# Patient Record
Sex: Female | Born: 1970 | Race: White | Hispanic: No | Marital: Married | Smoking: Never smoker
Health system: Southern US, Community
[De-identification: ages and names within clinical notes are randomized; demographics above are authoritative.]

## PROBLEM LIST (undated history)

## (undated) DIAGNOSIS — Z9109 Other allergy status, other than to drugs and biological substances: Secondary | ICD-10-CM

## (undated) DIAGNOSIS — I10 Essential (primary) hypertension: Secondary | ICD-10-CM

## (undated) HISTORY — PX: NO PAST SURGERIES: SHX2092

---

## 2008-05-09 ENCOUNTER — Ambulatory Visit: Payer: Self-pay | Admitting: Internal Medicine

## 2010-03-26 ENCOUNTER — Ambulatory Visit: Payer: Self-pay | Admitting: Family Medicine

## 2011-04-22 ENCOUNTER — Ambulatory Visit: Payer: Self-pay

## 2014-08-21 ENCOUNTER — Ambulatory Visit
Admission: EM | Admit: 2014-08-21 | Discharge: 2014-08-21 | Disposition: A | Discharge status: Home / Self Care | Payer: Federal, State, Local not specified - PPO | Attending: Physician Assistant | Admitting: Physician Assistant

## 2014-08-21 ENCOUNTER — Encounter: Payer: Self-pay | Admitting: Emergency Medicine

## 2014-08-21 ENCOUNTER — Ambulatory Visit: Payer: Federal, State, Local not specified - PPO

## 2014-08-21 ENCOUNTER — Other Ambulatory Visit: Payer: Self-pay

## 2014-08-21 DIAGNOSIS — R0982 Postnasal drip: Secondary | ICD-10-CM

## 2014-08-21 DIAGNOSIS — R0789 Other chest pain: Secondary | ICD-10-CM | POA: Insufficient documentation

## 2014-08-21 DIAGNOSIS — M549 Dorsalgia, unspecified: Secondary | ICD-10-CM | POA: Insufficient documentation

## 2014-08-21 DIAGNOSIS — M542 Cervicalgia: Secondary | ICD-10-CM | POA: Diagnosis present

## 2014-08-21 DIAGNOSIS — J302 Other seasonal allergic rhinitis: Secondary | ICD-10-CM

## 2014-08-21 MED ORDER — KETOROLAC TROMETHAMINE 60 MG/2ML IM SOLN
60.0000 mg | Freq: Once | INTRAMUSCULAR | Status: AC
  Administered 2014-08-21: 60 mg via INTRAMUSCULAR

## 2014-08-21 NOTE — Discharge Instructions (Signed)
The exam today included physical exam findings as well as EKG and Chest Xray, all of which are reported as normal. These are reassuring that the most probable cause for the discomfort is muscle exertion and tension. You have been in a very busy life and had extra stressors recently. Please be reassurred that your exam is negative this morning, but do NOT ignore increased pain or shortness of breath ( do your re-breathe practice ) , nausea, vomiting, profuse sweating or other  symptoms  that cause you to feel concerned SHOULD THEY OCCUR PLEASE REPORT TO THE EMERGENCY ROOM OF YOUR CHOICE !!  If you do not have someone else to drive --or the symptoms present severely-please call the rescue squad !      Musculoskeletal Pain Musculoskeletal pain is muscle and boney aches and pains. These pains can occur in any part of the body. Your caregiver may treat you without knowing the cause of the pain. They may treat you if blood or urine tests, X-rays, and other tests were normal.  CAUSES There is often not a definite cause or reason for these pains. These pains may be caused by a type of germ (virus). The discomfort may also come from overuse. Overuse includes working out too hard when your body is not fit. Boney aches also come from weather changes. Bone is sensitive to atmospheric pressure changes. HOME CARE INSTRUCTIONS  Ask when your test results will be ready. Make sure you get your test results. Only take over-the-counter or prescription medicines for pain, discomfort, or fever as directed by your caregiver. If you were given medications for your condition, do not drive, operate machinery or power tools, or sign legal documents for 24 hours. Do not drink alcohol. Do not take sleeping pills or other medications that may interfere with treatment. Continue all activities unless the activities cause more pain. When the pain lessens, slowly resume normal activities. Gradually increase the intensity and duration  of the activities or exercise. During periods of severe pain, bed rest may be helpful. Lay or sit in any position that is comfortable. Putting ice on the injured area. Put ice in a bag. Place a towel between your skin and the bag. Leave the ice on for 15 to 20 minutes, 3 to 4 times a day. Follow up with your caregiver for continued problems and no reason can be found for the pain. If the pain becomes worse or does not go away, it may be necessary to repeat tests or do additional testing. Your caregiver may need to look further for a possible cause. SEEK IMMEDIATE MEDICAL CARE IF: You have pain that is getting worse and is not relieved by medications. You develop chest pain that is associated with shortness or breath, sweating, feeling sick to your stomach (nauseous), or throw up (vomit). Your pain becomes localized to the abdomen. You develop any new symptoms that seem different or that concern you. MAKE SURE YOU:  Understand these instructions. Will watch your condition. Will get help right away if you are not doing well or get worse. Document Released: 02/05/2005 Document Revised: 04/30/2011 Document Reviewed: 10/10/2012 [facility] Patient Information 2015 [place]. This information is not intended to replace advice given to you by your health care provider. Make sure you discuss any questions you have with your health care provider.

## 2014-08-21 NOTE — ED Notes (Signed)
Neck pain that radiates to chest makes it hard to take a deep breath. Painful  At collar bone when bending over for 4 days

## 2014-08-21 NOTE — ED Provider Notes (Addendum)
CSN: [id number]     Arrival date & time 08/21/14  0804 History   None    Chief Complaint  Patient presents with  . Neck Pain   (Consider location/radiation/quality/duration/timing/severity/associated sxs/prior Treatment) HPI  44 yo F presents concerned about upper back/neck discomfort over the past few days that now presents as left shoulder and upper left chest pressure. Discomfort increases when she bends over. No cough, No SOB, No nausea, diaphoresis or malaise.Had scratchy throat a few days ago for less than a day and it resolved. Has been under increased stress at work ( boss out sick),also involved in childrens summer school, and has been physically active lifting and carrying. Denies URI or malaise- has chronic post nasal drip and commonly develops sinus infections. Both her children are on allergy meds but she has never been advised the same by her report. Her sister is a Engineer, civil (consulting)nurse and encouraged her to come in today to "r/o heart attack". Non Smoker/never. Only meds BCP. Drove herself here.  History reviewed. No pertinent past medical history. Past Surgical History  Procedure Laterality Date  . No past surgeries     History reviewed. No pertinent family history. History  Substance Use Topics  . Smoking status: Never Smoker   . Smokeless tobacco: Not on file  . Alcohol Use: No   OB History    No data available     Review of Systems  Constitutional: no fever. Baseline level of activity. Eyes: No visual changes. No red eyes/discharge. NWG:NFAOZENT:Brief scratchy throat, denies ear pain, has constant post nasal drip Cardiovascular:See HPI chest pain/pressure- no diaphoresis, nausea Respiratory: Negative for shortness of breath Gastrointestinal: No abdominal pain. No nausea,vomiting.No Diarrhea.No constipation. Genitourinary: Negative for dysuria.Normal urination. Musculoskeletal: Negative for back pain. FROM extremities without pain Skin: Negative for rash Neurological: Negative for  headache, focal weakness or numbness   Allergies  Review of patient's allergies indicates no known allergies.  Home Medications   Prior to Admission medications   Medication Sig Start Date End Date Taking? Authorizing Provider  norethindrone-ethinyl estradiol-iron (ESTROSTEP FE,TILIA FE,TRI-LEGEST FE) 1-20/1-30/1-35 MG-MCG tablet Take 1 tablet by mouth daily.   Yes Historical Provider, MD   BP 129/86 mmHg  Temp(Src) 97.6 F (36.4 C) (Oral)  Resp 16  Ht 5\' 5"  (1.651 m)  Wt 147 lb (66.679 kg)  BMI 24.46 kg/m2  SpO2 99%  LMP 08/14/2014 Physical Exam  Constitutional -alert and oriented,well appearing and in no acute distress Head-atraumatic Eyes- conjunctiva normal, EOMI ,conjugate gaze Nose- no congestion or rhinorrhea Mouth/throat- mucous membranes moist ,oropharynx non-erythematous, cobblestoning Neck- supple without glandular enlargement CV- regular rate, grossly normal heart sounds, good peripheral circulation Resp-no distress, normal respiratory effort,clear to auscultation bilaterally, rate 16,unlabored, without pain GI-,no distention GU- not examined MSK- UEs- FROM without pain, palpation of pectoralis on left reveals discomfort similar to that which she has experienced; recreated with ROM routines. LEs:no lower extremity tenderness nor edema,no joint effusion, ambulatory without assistance or difficulty Neuro- normal speech and language, no gross focal neurological deficit appreciated, no gait instability Skin-warm,dry ,intact; no rash noted Psych-mood and affect grossly normal; speech and behavior grossly normal  ED Course  Procedures (including critical care time) Labs Review Labs Reviewed - No data to display  Imaging Review No results found.  EKG - NSR, rate 80, normal EKG      ( Results did not drop) CXR-No active cardiopulmonary disease    (Results did not drop)   MDM   1. Left-sided chest wall pain  2. Upper back pain on left side   3. Post-nasal drip    4. Seasonal allergies      Plan: 1.  Diagnosis reviewed with patient-seasonal allergies suspected, muscle strain/tension 2. Rx claritn, fluticasone ; risks, benefits, potential side effects reviewed with patient 3. Recommend supportive treatment with OTCguafenesin/tylenol/ibuprofen/fluids 4. Muscle tension use NSAIDs, hot showers, massage, relaxation techniques 4. F/u prn if symptoms worsen or don't improve; viral syndrome can have similar muscle aching 5. Do NOT ignore increased symptoms, SOB, diaphoresis,malaise, chest pain deep or other  concerns and report to medical for evaluation directly  6. Strongly encouraged to establish PCP care 1. Test/x-ray results and diagnosis reviewed with patient 2. rx as per orders; risks, benefits, potential side effects reviewed with patient 3. Recommend supportive treatment with  4. F/u prn if symptoms worsen or don't improve   Rae Halsted, PA-C 08/23/14 2136  Medications  ketorolac (TORADOL) injection 60 mg (60 mg Intramuscular Given 08/21/14 0945)  well tolerated by patient  Rae Halsted, PA-C 11/05/14 1711

## 2014-08-23 ENCOUNTER — Encounter: Payer: Self-pay | Admitting: Physician Assistant

## 2016-05-27 ENCOUNTER — Ambulatory Visit
Admission: EM | Admit: 2016-05-27 | Discharge: 2016-05-27 | Disposition: A | Discharge status: Home / Self Care | Payer: Federal, State, Local not specified - PPO

## 2016-05-27 DIAGNOSIS — J0100 Acute maxillary sinusitis, unspecified: Secondary | ICD-10-CM | POA: Diagnosis not present

## 2016-05-27 HISTORY — DX: Other allergy status, other than to drugs and biological substances: Z91.09

## 2016-05-27 MED ORDER — AMOXICILLIN-POT CLAVULANATE 875-125 MG PO TABS: 1.0000 | ORAL_TABLET | Freq: Two times a day (BID) | ORAL | 0 refills | Status: AC

## 2016-05-27 NOTE — ED Provider Notes (Signed)
CSN: [id number]     Arrival date & time 05/27/16  0856 History   First MD Initiated Contact with Patient 05/27/16 1010     Chief Complaint  Patient presents with  . Cough  . Facial Pain   (Consider location/radiation/quality/duration/timing/severity/associated sxs/prior Treatment) 46 year old female presents with nasal congestion, sinus headache and pressure for the past 5 days. Also experiencing low grade fever, nausea and bloody nasal discharge. Teeth hurt due to sinus pain. Denies any vomiting or diarrhea. Has taken Advil cold and sinus with minimal relief. Has history of seasonal allergies and recurrent sinus infections. Last infection about 6 months ago. Also has history of IBS but not on any current medication.    The history is provided by the patient.    Past Medical History:  Diagnosis Date  . Environmental allergies    Past Surgical History:  Procedure Laterality Date  . NO PAST SURGERIES     History reviewed. No pertinent family history. Social History  Substance Use Topics  . Smoking status: Never Smoker  . Smokeless tobacco: Never Used  . Alcohol use No   OB History    No data available     Review of Systems  Constitutional: Positive for fatigue and fever. Negative for appetite change and chills.  HENT: Positive for congestion, nosebleeds, postnasal drip, rhinorrhea, sinus pain, sinus pressure and sore throat. Negative for ear discharge, ear pain, facial swelling, sneezing, tinnitus and trouble swallowing.   Eyes: Negative for discharge and visual disturbance.  Respiratory: Positive for cough. Negative for chest tightness, shortness of breath and wheezing.   Cardiovascular: Negative for chest pain.  Gastrointestinal: Positive for nausea. Negative for abdominal pain, diarrhea and vomiting.  Genitourinary: Negative for difficulty urinating.  Musculoskeletal: Negative for arthralgias, back pain, myalgias and neck pain.  Skin: Negative for rash and wound.   Neurological: Positive for headaches. Negative for dizziness, syncope, weakness, light-headedness and numbness.  Hematological: Negative for adenopathy.    Allergies  Patient has no known allergies.  Home Medications   Prior to Admission medications   Medication Sig Start Date End Date Taking? Authorizing Provider  fluticasone (FLONASE) 50 MCG/ACT nasal spray Place into both nostrils daily.   Yes Historical Provider, MD  amoxicillin-clavulanate (AUGMENTIN) 875-125 MG tablet Take 1 tablet by mouth every 12 (twelve) hours. 05/27/16 06/03/16  Sudie Grumbling, NP  norethindrone-ethinyl estradiol-iron (ESTROSTEP FE,TILIA FE,TRI-LEGEST FE) 1-20/1-30/1-35 MG-MCG tablet Take 1 tablet by mouth daily.    Historical Provider, MD   Meds Ordered and Administered this Visit  Medications - No data to display  BP (!) 165/91 (BP Location: Left Arm)   Pulse 92   Temp 97.5 F (36.4 C) (Oral)   Resp 18   Ht 5' 5.5" (1.664 m)   Wt 162 lb (73.5 kg)   LMP 03/29/2016   SpO2 95%   BMI 26.55 kg/m  No data found.   Physical Exam  Constitutional: She is oriented to person, place, and time. She appears well-developed and well-nourished. She appears ill. No distress.  HENT:  Head: Normocephalic and atraumatic.  Right Ear: Hearing, external ear and ear canal normal. Tympanic membrane is bulging. A middle ear effusion is present.  Left Ear: Hearing, external ear and ear canal normal. Tympanic membrane is bulging. A middle ear effusion is present.  Nose: Mucosal edema and rhinorrhea present. Right sinus exhibits maxillary sinus tenderness. Right sinus exhibits no frontal sinus tenderness. Left sinus exhibits maxillary sinus tenderness. Left sinus exhibits no frontal sinus  tenderness.  Mouth/Throat: Uvula is midline and mucous membranes are normal. Posterior oropharyngeal erythema present.  Neck: Normal range of motion. Neck supple.  Cardiovascular: Normal rate, regular rhythm and normal heart sounds.   No  murmur heard. Pulmonary/Chest: Effort normal and breath sounds normal. No respiratory distress. She has no decreased breath sounds. She has no wheezes. She has no rhonchi.  Lymphadenopathy:    She has no cervical adenopathy.  Neurological: She is alert and oriented to person, place, and time.  Skin: Skin is warm and dry. Capillary refill takes less than 2 seconds.  Psychiatric: She has a normal mood and affect. Her behavior is normal. Judgment and thought content normal.    Urgent Care Course     Procedures (including critical care time)  Labs Review Labs Reviewed - No data to display  Imaging Review No results found.   Visual Acuity Review  Right Eye Distance:   Left Eye Distance:   Bilateral Distance:    Right Eye Near:   Left Eye Near:    Bilateral Near:         MDM   1. Acute non-recurrent maxillary sinusitis    Recommend start Augmentin  twice a day as directed. May continue Flonase as directed. May also try Claritin  daily for allergy symptoms. Avoid Sudafed or decongestants due to possible increase in blood pressure. Increase fluid intake to help loosen up mucus. Follow-up with your primary care provider in 3 to 4 days if not improving.      Sudie Grumbling, NP 05/27/16 [phone]

## 2016-05-27 NOTE — Discharge Instructions (Addendum)
Start Augmentin  twice a day as directed. May continue Flonase as directed. May also try Claritin  daily for allergy symptoms. Increase fluid intake to help loosen up mucus. Follow-up with your primary care provider in 3 to 4 days if not improving.

## 2016-05-27 NOTE — ED Triage Notes (Signed)
Pt with facial pain/headache, blowing blood from nose, cough. Pain 7/10. Sx x Wednesday and hx of sinus infections.

## 2018-07-20 ENCOUNTER — Encounter: Payer: Self-pay | Admitting: Emergency Medicine

## 2018-07-20 ENCOUNTER — Ambulatory Visit
Admission: EM | Admit: 2018-07-20 | Discharge: 2018-07-20 | Disposition: A | Discharge status: Home / Self Care | Payer: Federal, State, Local not specified - PPO

## 2018-07-20 ENCOUNTER — Other Ambulatory Visit: Payer: Self-pay

## 2018-07-20 DIAGNOSIS — H1033 Unspecified acute conjunctivitis, bilateral: Secondary | ICD-10-CM

## 2018-07-20 MED ORDER — POLYMYXIN B-TRIMETHOPRIM 10000-0.1 UNIT/ML-% OP SOLN: 1.0000 [drp] | Freq: Four times a day (QID) | OPHTHALMIC | 0 refills | Status: DC

## 2018-07-20 NOTE — Discharge Instructions (Addendum)
It was very nice meeting you today in clinic. Thank you for entrusting me with your care.   Please utilize the medications that we discussed. Your prescriptions have been called in to your pharmacy. Cool compresses can help soothe your eyes. AVOID rubbing your eyes.   Make arrangements to follow up with your regular doctor, or your eye doctor, in 1 week for re-evaluation if not improving. If your symptoms/condition worsens, please seek follow up care either here or in the ER. Please remember, our [facility] providers are "right here with you" when you need [place].   Again, it was my pleasure to take care of you today. Thank you for choosing our clinic. I hope that you start to feel better quickly.   Quentin Mulling, MSN, APRN, FNP-C, CEN Advanced Practice Provider  [facility]

## 2018-07-20 NOTE — ED Triage Notes (Signed)
Patient c/o redness, drainage and pain in both eyes that started yesterday.  Patient denies fevers.

## 2018-07-20 NOTE — ED Provider Notes (Signed)
[address], [address] [place], [place] [address] [phone]   Name: Bonnie GuysBeatrice R Marsland DOB: 1970/06/12 MRN: [id number] CSN: [id number] PCP: Marina GoodellFeldpausch, Dale E, MD  Arrival date and time:  07/20/18 1034  Chief Complaint:  Eye Problem  NOTE: Prior to seeing the patient today, I have reviewed the triage nursing documentation and vital signs. Clinical staff has updated patient's PMH/PSHx, current medication list, and drug allergies/intolerances to ensure comprehensive history available to assist in medical decision making.   History:   HPI: Bonnie Hoffman is a 48 y.o. female who presents today with complaints of pain and redness in her BILATERAL eyes that began with acute onset on 07/19/2018.  Patient notes that her eyes have been draining a "creamy" substance and that her vision has been blurred (R>L). (+) excessive tearing throughout the day yesterday. She woke this morning with an excessive amount of exudative crusting that had her eyes "matted shut".  Patient denies history of seasonal allergies.  She has not been working outside.  Patient denies any injuries to her eyes.  She is making a conscious effort to avoid excessive rubbing.  Visual Acuity: Right Eye Distance: 20/50 uncorrected Left Eye Distance: 20/40 uncorrected Bilateral Distance: 20/30 uncorrected     Past Medical History:  Diagnosis Date  . Environmental allergies     Past Surgical History:  Procedure Laterality Date  . NO PAST SURGERIES      Family History  Problem Relation Age of Onset  . Healthy Mother   . Cancer Father     Social History   Socioeconomic History  . Marital status: Married    Spouse name: Not on file  . Number of children: Not on file  . Years of education: Not on file  . Highest education level: Not on file  Occupational History  . Not on file  Social Needs  . Financial resource strain: Not on file  . Food insecurity:    Worry: Not on file    Inability: Not on file  .  Transportation needs:    Medical: Not on file    Non-medical: Not on file  Tobacco Use  . Smoking status: Never Smoker  . Smokeless tobacco: Never Used  Substance and Sexual Activity  . Alcohol use: No  . Drug use: No  . Sexual activity: Not on file  Lifestyle  . Physical activity:    Days per week: Not on file    Minutes per session: Not on file  . Stress: Not on file  Relationships  . Social connections:    Talks on phone: Not on file    Gets together: Not on file    Attends religious service: Not on file    Active member of club or organization: Not on file    Attends meetings of clubs or organizations: Not on file    Relationship status: Not on file  . Intimate partner violence:    Fear of current or ex partner: Not on file    Emotionally abused: Not on file    Physically abused: Not on file    Forced sexual activity: Not on file  Other Topics Concern  . Not on file  Social History Narrative  . Not on file    There are no active problems to display for this patient.   Home Medications:    Current Meds  Medication Sig  . loratadine (CLARITIN) 10 MG tablet Take 10 mg by mouth daily.  . norethindrone-ethinyl estradiol-iron (ESTROSTEP FE,TILIA FE,TRI-LEGEST  FE) 1-20/1-30/1-35 MG-MCG tablet Take 1 tablet by mouth daily.  . sertraline (ZOLOFT) 25 MG tablet Take by mouth.    Allergies:   Patient has no known allergies.  Review of Systems (ROS): Review of Systems  Constitutional: Negative for chills and fever.  HENT: Negative for congestion, rhinorrhea, sinus pressure, sinus pain and sore throat.   Eyes: Positive for pain, discharge ("creamy white"), redness, itching and visual disturbance (blurred). Negative for photophobia.  Respiratory: Negative for cough and shortness of breath.   Cardiovascular: Negative for chest pain and palpitations.  Allergic/Immunologic: Negative for environmental allergies (denies seasonal allergies).  Neurological: Negative for  dizziness and headaches.     Physical Exam:  Triage Vital Signs ED Triage Vitals  Enc Vitals Group     BP 07/20/18 1125 (!) 179/99     Pulse Rate 07/20/18 1125 69     Resp 07/20/18 1125 14     Temp 07/20/18 1125 99.2 F (37.3 C)     Temp Source 07/20/18 1125 Oral     SpO2 07/20/18 1125 99 %     Weight 07/20/18 1122 162 lb (73.5 kg)     Height 07/20/18 1122 5' 5.5" (1.664 m)     Head Circumference --      Peak Flow --      Pain Score 07/20/18 1122 2     Pain Loc --      Pain Edu? --      Excl. in GC? --     Physical Exam  Constitutional: She is oriented to person, place, and time and well-developed, well-nourished, and in no distress.  HENT:  Head: Normocephalic and atraumatic.  Mouth/Throat: Oropharynx is clear and moist and mucous membranes are normal.  Eyes: Pupils are equal, round, and reactive to light. EOM are normal. Lids are everted and swept, no foreign bodies found. Right eye exhibits exudate (+) honey colored crusting to bottom lashes. No foreign body present in the right eye. Left eye exhibits exudate (+) honey colored crusting to bottom lashes. No foreign body present in the left eye. Right conjunctiva is injected. Left conjunctiva is injected.  External eyes tender. No significant upper/lower lid edema.   Neck: Normal range of motion. Neck supple. No tracheal deviation present.  Cardiovascular: Normal rate, regular rhythm, normal heart sounds and intact distal pulses. Exam reveals no gallop and no friction rub.  No murmur heard. Pulmonary/Chest: Effort normal and breath sounds normal. No respiratory distress. She has no wheezes. She has no rales.  Neurological: She is alert and oriented to person, place, and time.  Skin: Skin is warm and dry. No rash noted. No erythema.  Psychiatric: Mood, affect and judgment normal.  Nursing note and vitals reviewed.    Urgent Care Treatments / Results:   LABS: PLEASE NOTE: all labs that were ordered this encounter are  listed, however only abnormal results are displayed. Labs Reviewed - No data to display  EKG: -None  RADIOLOGY: No results found.  PRODEDURES: Procedures  MEDICATIONS RECEIVED THIS VISIT: Medications - No data to display  PERTINENT CLINICAL COURSE NOTES/UPDATES: No data to display   Initial Impression / Assessment and Plan / Urgent Care Course:    JOLEE SEARER is a 48 y.o. female who [facility] today with complaints of Eye Problem  Pertinent labs & imaging results that were available during my care of the patient were personally reviewed by me and considered in my medical decision making (see lab/imaging section of  note for values and interpretations).  Exam reveals exudative crusting bilaterally.  Sclera of both eyes are erythematous.  External eyelids are tender to palpation.  Patient notes that her vision is blurred, and that eyes feel "scratchy".  PMH not significant for issues with allergies, and patient denies being outdoors.  Differential includes allergic versus bacterial conjunctivitis.  With pain, vision changes, and noted exudative crusting will treat for bacterial conjunctivitis using Polytrim ophthalmic drops 4 times daily to both eyes x5 days.  Patient encouraged to utilize cool compresses to help soothe her eyes.  She was educated on transmission and encouraged to avoid rubbing her eyes and wash her hands frequently.  Discussed follow up with primary care physician or eye provider in 1 week for re-evaluation if not resolved. I have reviewed the follow up and strict return precautions for any new or worsening symptoms. Patient is aware of symptoms that would be deemed urgent/emergent, and would thus require further evaluation either here or in the emergency department. At the time of discharge, she verbalized understanding and consent with the discharge plan as it was reviewed with her. All questions were fielded by provider and/or clinic staff prior  to patient discharge.    Final Clinical Impressions(s) / Urgent Care Diagnoses:   Final diagnoses:  Acute bacterial conjunctivitis of both eyes    New Prescriptions:   Meds ordered this encounter  Medications  . trimethoprim-polymyxin b (POLYTRIM) ophthalmic solution    Sig: Place 1 drop into both eyes every 6 (six) hours. X 5 days    Dispense:  10 mL    Refill:  0    Controlled Substance Prescriptions:  [place] Controlled Substance Registry consulted? Not Applicable  NOTE: This note was prepared using Dragon dictation software along with smaller phrase technology. Despite my best ability to proofread, there is the potential that transcriptional errors may still occur from this process, and are completely unintentional.      Verlee Monte, NP 07/21/18 1151

## 2020-04-20 ENCOUNTER — Other Ambulatory Visit: Payer: Self-pay | Admitting: Gastroenterology

## 2020-04-20 DIAGNOSIS — R748 Abnormal levels of other serum enzymes: Secondary | ICD-10-CM

## 2020-04-29 ENCOUNTER — Other Ambulatory Visit: Payer: Self-pay

## 2020-04-29 ENCOUNTER — Ambulatory Visit
Admission: RE | Admit: 2020-04-29 | Discharge: 2020-04-29 | Disposition: A | Discharge status: Home / Self Care | Payer: Federal, State, Local not specified - PPO | Source: Ambulatory Visit | Attending: Gastroenterology | Admitting: Gastroenterology

## 2020-04-29 DIAGNOSIS — R748 Abnormal levels of other serum enzymes: Secondary | ICD-10-CM | POA: Insufficient documentation

## 2020-07-24 ENCOUNTER — Ambulatory Visit
Admission: EM | Admit: 2020-07-24 | Discharge: 2020-07-24 | Disposition: A | Discharge status: Home / Self Care | Payer: Federal, State, Local not specified - PPO | Attending: Emergency Medicine | Admitting: Emergency Medicine

## 2020-07-24 ENCOUNTER — Other Ambulatory Visit: Payer: Self-pay

## 2020-07-24 DIAGNOSIS — J069 Acute upper respiratory infection, unspecified: Secondary | ICD-10-CM | POA: Insufficient documentation

## 2020-07-24 HISTORY — DX: Essential (primary) hypertension: I10

## 2020-07-24 LAB — POCT RAPID STREP A: Streptococcus, Group A Screen (Direct): NEGATIVE

## 2020-07-24 MED ORDER — BENZONATATE 100 MG PO CAPS: 200.0000 mg | ORAL_CAPSULE | Freq: Three times a day (TID) | ORAL | 0 refills | Status: DC

## 2020-07-24 MED ORDER — IPRATROPIUM BROMIDE 0.06 % NA SOLN: 2.0000 | Freq: Four times a day (QID) | NASAL | 12 refills | Status: DC

## 2020-07-24 MED ORDER — PROMETHAZINE-DM 6.25-15 MG/5ML PO SYRP: 5.0000 mL | ORAL_SOLUTION | Freq: Four times a day (QID) | ORAL | 0 refills | Status: DC | PRN

## 2020-07-24 NOTE — Discharge Instructions (Addendum)
Use the Atrovent nasal spray, 2 squirts in each nostril every 6 hours, as needed for runny nose and postnasal drip.  Use the Tessalon Perles every 8 hours during the day.  Take them with a small sip of water.  They may give you some numbness to the base of your tongue or a metallic taste in your mouth, this is normal.  Use the Promethazine DM cough syrup at bedtime for cough and congestion.  It will make you drowsy so do not take it during the day.  We will send your throat swab for culture and treat you if it comes back positive.  Return for reevaluation or see your primary care provider for any new or worsening symptoms.

## 2020-07-24 NOTE — ED Triage Notes (Signed)
Pt c/o sore throat since Friday, cough, ear pain and runny nose. Pt also reports elevated temp of 99.1 last night. Pt denies chills, n/v/d or other symptoms. Pt has not taken any meds for her symptoms.

## 2020-07-24 NOTE — ED Provider Notes (Signed)
[facility]    CSN: [id number] Arrival date & time: 07/24/20  0816      History   Chief Complaint Chief Complaint  Patient presents with  . Sore Throat    HPI Bonnie Hoffman is a 50 y.o. female.   HPI   50 year old female here for evaluation of sore throat.  That she has had a sore throat for the past 2 days.  She is also had the associated symptoms of nasal congestion with clear nasal discharge, scratchy and sharp sore throat, nonproductive cough, and body aches.  She had an elevated temp to 99.1.  She denies shortness of breath or wheezing, GI complaints, or COVID or flu exposure.  She has had her flu vaccine.  Patient reports that she was exposed to a member of her church last Sunday that had a sore throat but she is unsure of what that source was.  Past Medical History:  Diagnosis Date  . Environmental allergies   . Hypertension     There are no problems to display for this patient.   Past Surgical History:  Procedure Laterality Date  . NO PAST SURGERIES      OB History   No obstetric history on file.      Home Medications    Prior to Admission medications   Medication Sig Start Date End Date Taking? Authorizing Provider  benzonatate (TESSALON) 100 MG capsule Take 2 capsules (200 mg total) by mouth every 8 (eight) hours. 07/24/20  Yes Becky Augusta, NP  ipratropium (ATROVENT) 0.06 % nasal spray Place 2 sprays into both nostrils 4 (four) times daily. 07/24/20  Yes Becky Augusta, NP  levonorgestrel (MIRENA, 52 MG,) 20 MCG/DAY IUD by Intrauterine route.   Yes [provider]  loratadine (CLARITIN) 10 MG tablet Take 10 mg by mouth daily.   Yes [provider]  losartan (COZAAR) 50 MG tablet Take 1 tablet by mouth daily. 06/07/20  Yes [provider]  promethazine-dextromethorphan (PROMETHAZINE-DM) 6.25-15 MG/5ML syrup Take 5 mLs by mouth 4 (four) times daily as needed. 07/24/20  Yes Becky Augusta, NP  norethindrone-ethinyl  estradiol-iron (ESTROSTEP FE,TILIA FE,TRI-LEGEST FE) 1-20/1-30/1-35 MG-MCG tablet Take 1 tablet by mouth daily.  07/24/20  [provider]  sertraline (ZOLOFT) 25 MG tablet Take by mouth. 03/24/18 07/24/20  [provider]    Family History Family History  Problem Relation Age of Onset  . Healthy Mother   . Cancer Father     Social History Social History   Tobacco Use  . Smoking status: Never Smoker  . Smokeless tobacco: Never Used  Vaping Use  . Vaping Use: Never used  Substance Use Topics  . Alcohol use: No  . Drug use: No     Allergies   Patient has no known allergies.   Review of Systems Review of Systems  Constitutional: Positive for fever. Negative for activity change and appetite change.  HENT: Positive for congestion, ear pain, postnasal drip, rhinorrhea and sore throat.   Respiratory: Positive for cough. Negative for shortness of breath and wheezing.   Gastrointestinal: Negative for diarrhea, nausea and vomiting.  Musculoskeletal: Positive for arthralgias and myalgias.  Skin: Negative for rash.  Hematological: Negative.   Psychiatric/Behavioral: Negative.      Physical Exam Triage Vital Signs ED Triage Vitals  Enc Vitals Group     BP 07/24/20 0842 134/76     Pulse Rate 07/24/20 0842 93     Resp 07/24/20 0842 18  Temp 07/24/20 0842 98.5 F (36.9 C)     Temp Source 07/24/20 0842 Oral     SpO2 07/24/20 0842 99 %     Weight 07/24/20 0840 170 lb (77.1 kg)     Height 07/24/20 0840 5\' 5"  (1.651 m)     Head Circumference --      Peak Flow --      Pain Score 07/24/20 0840 8     Pain Loc --      Pain Edu? --      Excl. in GC? --    No data found.  Updated Vital Signs BP 134/76 (BP Location: Left Arm)   Pulse 93   Temp 98.5 F (36.9 C) (Oral)   Resp 18   Ht 5\' 5"  (1.651 m)   Wt 170 lb (77.1 kg)   SpO2 99%   BMI 28.29 kg/m   Visual Acuity Right Eye Distance:   Left Eye Distance:   Bilateral Distance:    Right Eye Near:    Left Eye Near:    Bilateral Near:     Physical Exam Vitals and nursing note reviewed.  Constitutional:      General: She is not in acute distress.    Appearance: Normal appearance. She is normal weight. She is not ill-appearing.  HENT:     Head: Normocephalic and atraumatic.     Right Ear: Tympanic membrane, ear canal and external ear normal. There is no impacted cerumen.     Left Ear: Tympanic membrane, ear canal and external ear normal. There is no impacted cerumen.     Nose: Congestion and rhinorrhea present.     Mouth/Throat:     Mouth: Mucous membranes are moist.     Pharynx: Oropharyngeal exudate and posterior oropharyngeal erythema present.  Cardiovascular:     Rate and Rhythm: Normal rate and regular rhythm.     Pulses: Normal pulses.     Heart sounds: Normal heart sounds. No murmur heard. No gallop.   Pulmonary:     Effort: Pulmonary effort is normal.     Breath sounds: Normal breath sounds. No wheezing, rhonchi or rales.  Musculoskeletal:     Cervical back: Normal range of motion and neck supple.  Lymphadenopathy:     Cervical: No cervical adenopathy.  Skin:    General: Skin is warm and dry.     Capillary Refill: Capillary refill takes less than 2 seconds.     Findings: No erythema or rash.  Neurological:     General: No focal deficit present.     Mental Status: She is alert and oriented to person, place, and time.  Psychiatric:        Mood and Affect: Mood normal.        Behavior: Behavior normal.        Thought Content: Thought content normal.        Judgment: Judgment normal.      UC Treatments / Results  Labs (all labs ordered are listed, but only abnormal results are displayed) Labs Reviewed  CULTURE, GROUP A STREP [facility])  POCT RAPID STREP A, ED / UC  POCT RAPID STREP A    EKG   Radiology No results found.  Procedures Procedures (including critical care time)  Medications Ordered in UC Medications - No data to display  Initial  Impression / Assessment and Plan / UC Course  I have reviewed the triage vital signs and the nursing notes.  Pertinent labs & imaging results that were  available during my care of the patient were reviewed by me and considered in my medical decision making (see chart for details).   50 year old female here for evaluation of cold symptoms as outlined in HPI above.  Physical exam reveals pearly gray tympanic membranes with a normal light reflex and clear external auditory canals.  Nasal mucosa is erythematous and edematous with scant clear nasal discharge.  Oropharyngeal exam reveals erythema of the tonsillar pillars with 1+ edema and white exudate.  Posterior oropharynx has mild postnasal drip and erythema.  No cervical lymphadenopathy appreciated exam.  Cardiopulmonary exam is benign.  We will collect rapid strep on patient.  Prep strep is negative we will send confirmatory culture.  Will discharge patient home with a diagnosis of viral URI with Atrovent nasal spray, Tessalon Perles, and Promethazine DM cough syrup for symptom relief.   Final Clinical Impressions(s) / UC Diagnoses   Final diagnoses:  Viral URI with cough     Discharge Instructions     Use the Atrovent nasal spray, 2 squirts in each nostril every 6 hours, as needed for runny nose and postnasal drip.  Use the Tessalon Perles every 8 hours during the day.  Take them with a small sip of water.  They may give you some numbness to the base of your tongue or a metallic taste in your mouth, this is normal.  Use the Promethazine DM cough syrup at bedtime for cough and congestion.  It will make you drowsy so do not take it during the day.  We will send your throat swab for culture and treat you if it comes back positive.  Return for reevaluation or see your primary care provider for any new or worsening symptoms.     ED Prescriptions    Medication Sig Dispense Auth. Provider   ipratropium (ATROVENT) 0.06 % nasal spray Place  2 sprays into both nostrils 4 (four) times daily. 15 mL Becky Augusta, NP   benzonatate (TESSALON) 100 MG capsule Take 2 capsules (200 mg total) by mouth every 8 (eight) hours. 21 capsule Becky Augusta, NP   promethazine-dextromethorphan (PROMETHAZINE-DM) 6.25-15 MG/5ML syrup Take 5 mLs by mouth 4 (four) times daily as needed. 118 mL Becky Augusta, NP     PDMP not reviewed this encounter.   Becky Augusta, NP 07/24/20 [phone]

## 2020-07-27 LAB — CULTURE, GROUP A STREP (THRC)

## 2022-01-13 IMAGING — US US ABDOMEN COMPLETE
1 series · 14 of 25 positions shown · non-contrast
Comparison: None.

CLINICAL DATA: Elevated liver enzymes

EXAM:
ABDOMEN ULTRASOUND COMPLETE

[Series 1: us abdomen complete · 0.25mm/px · 14 of 79 slices shown]
[im 1/79]
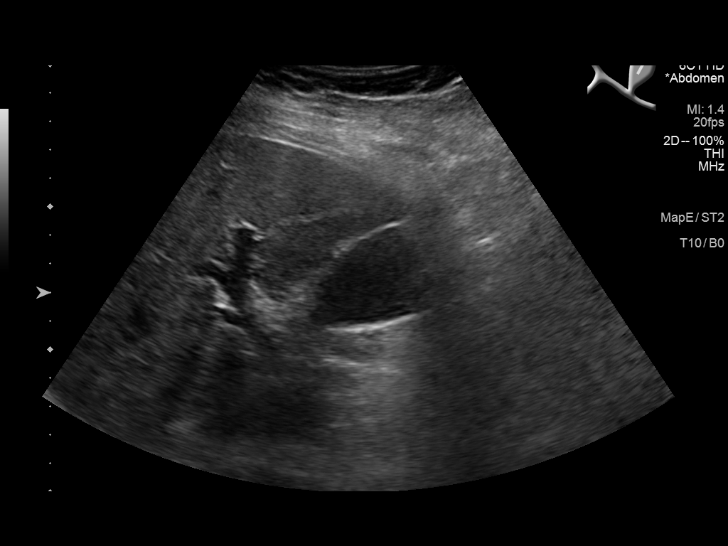
[im 7/79]
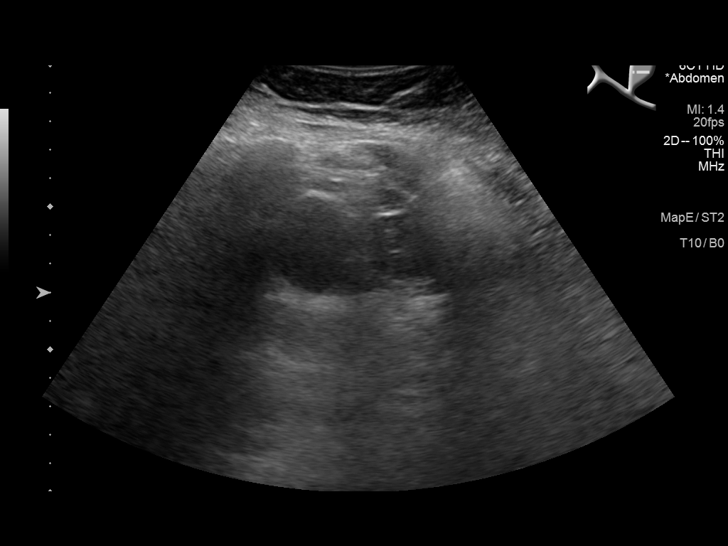
[im 14/79]
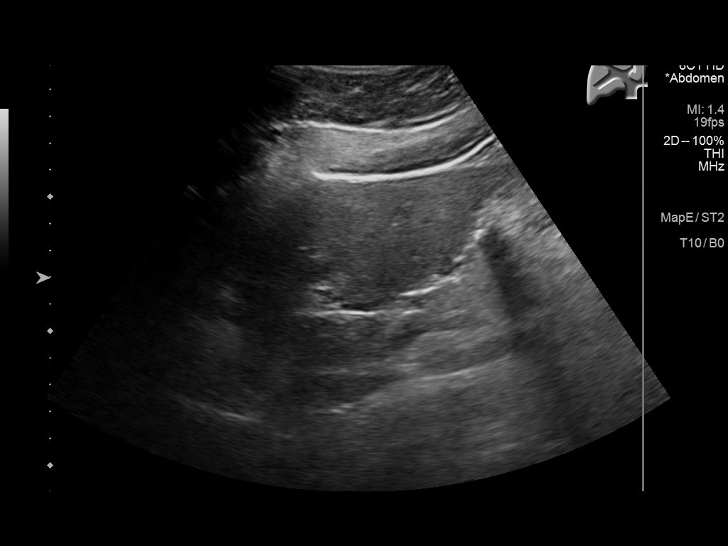
[im 20/79]
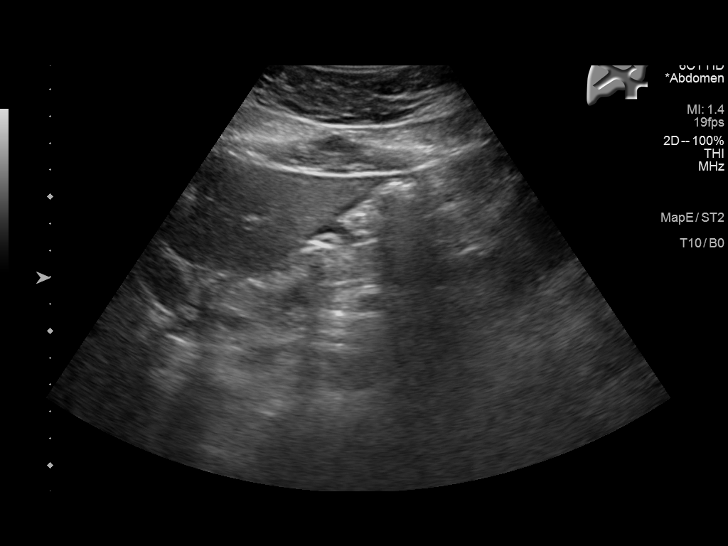
[im 27/79]
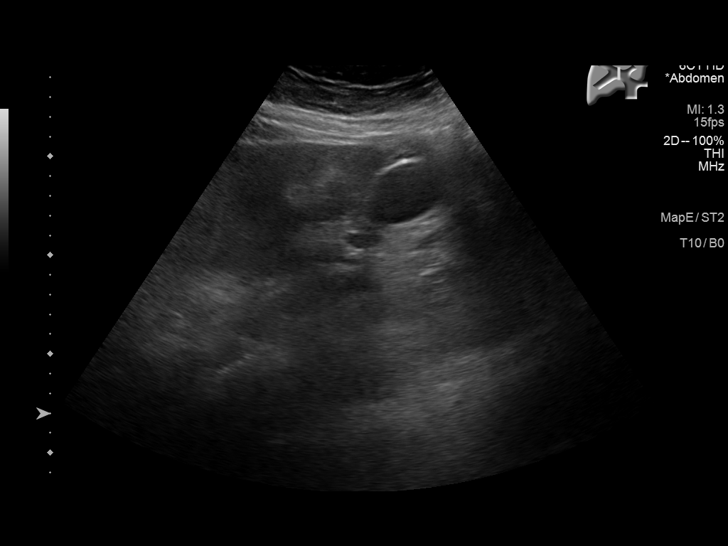
[im 30/79]
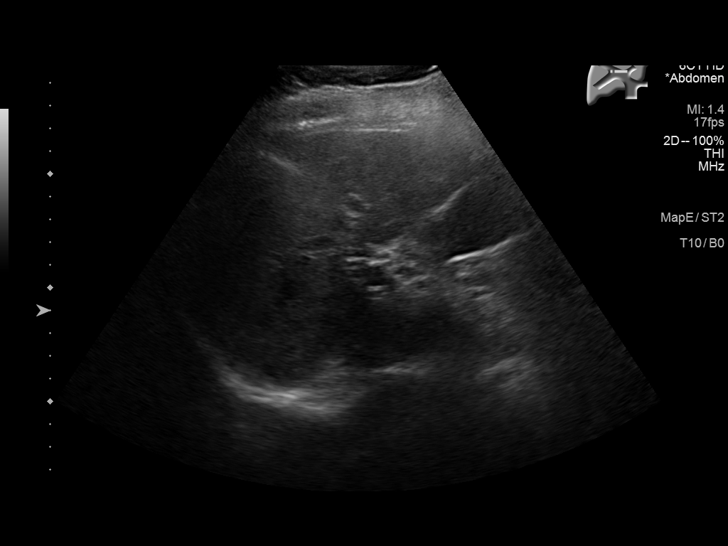
[im 36/79]
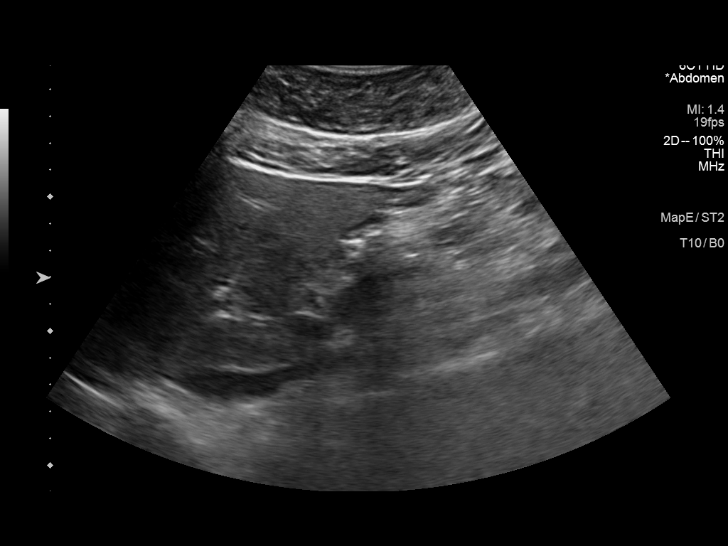
[im 43/79]
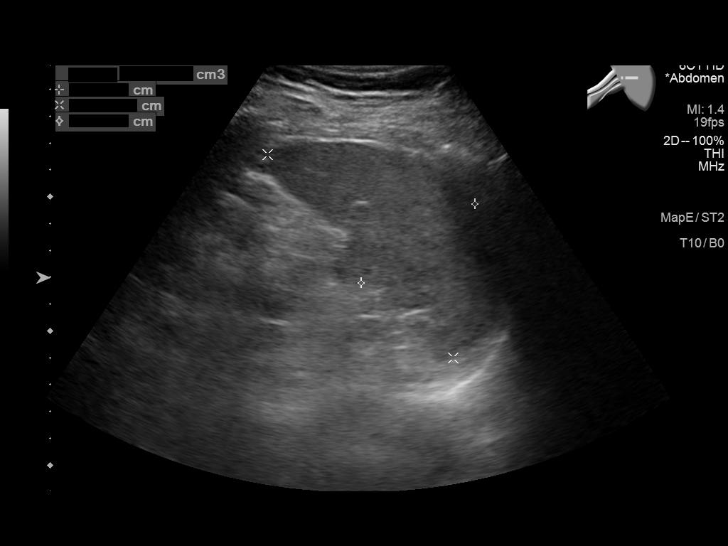
[im 49/79]
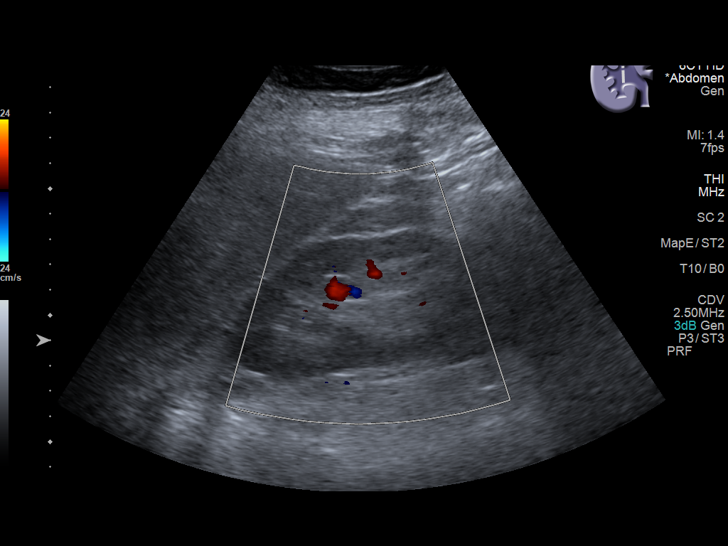
[im 53/79]
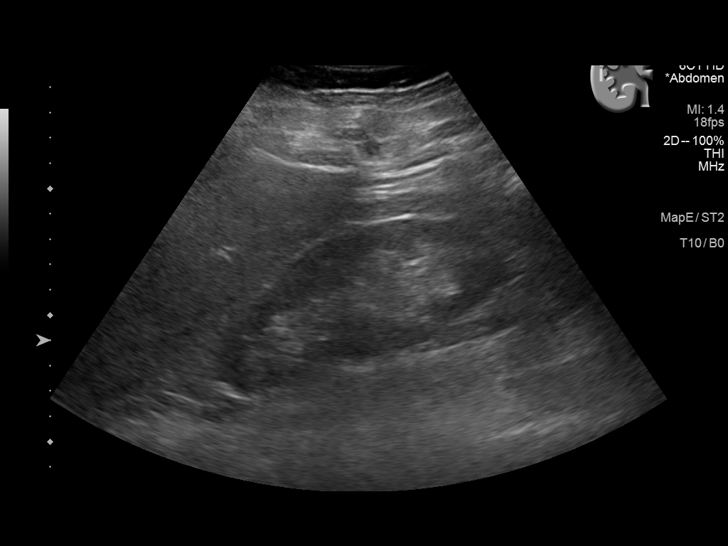
[im 59/79]
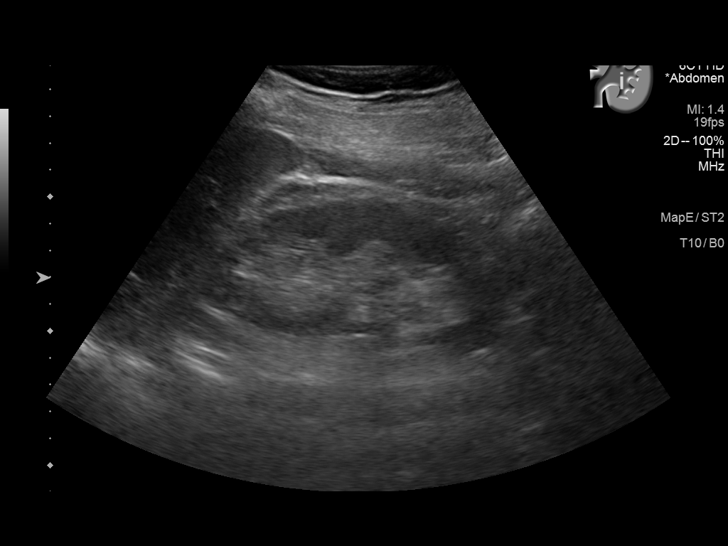
[im 66/79]
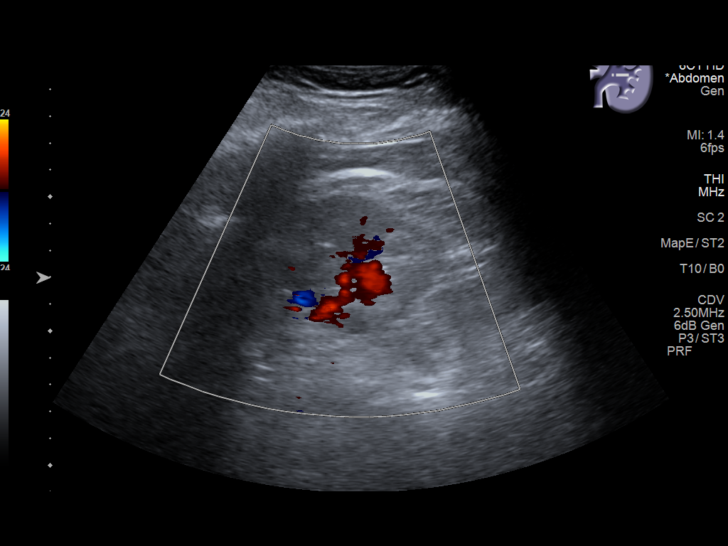
[im 72/79]
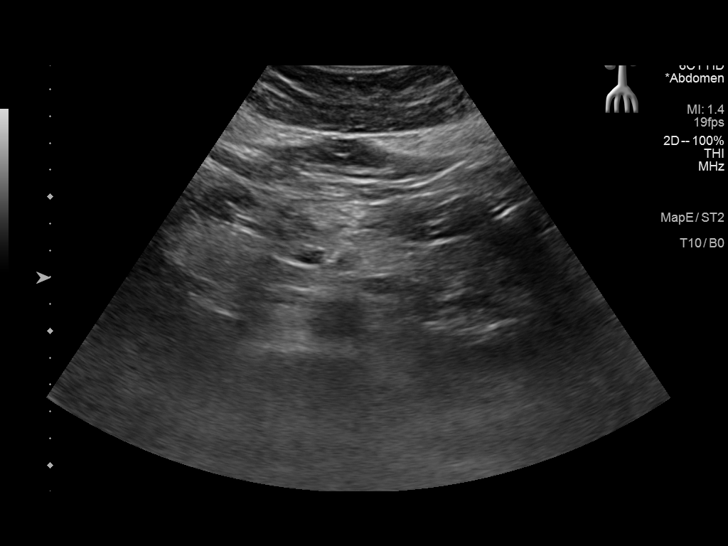
[im 79/79]
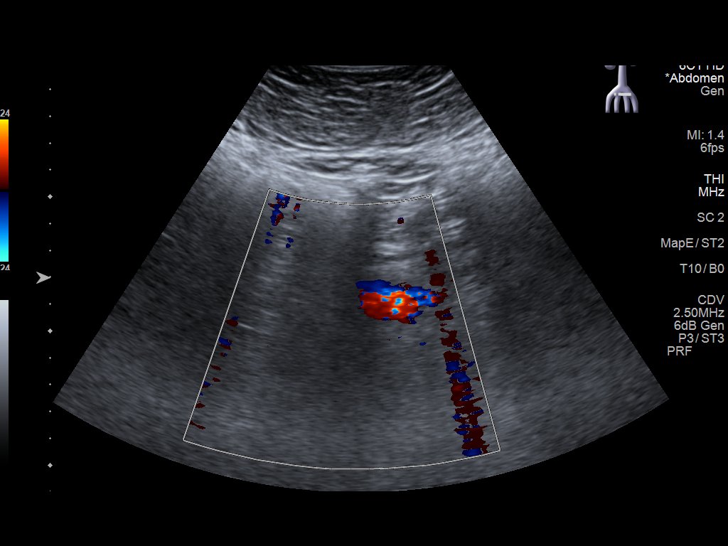

[14 of 25 positions shown; findings below may reference images not displayed]

FINDINGS: Gallbladder: No gallstones or wall thickening visualized. No
sonographic Murphy sign noted by sonographer.

Common bile duct: Diameter: 5.3 mm

Liver: Increased echogenicity with no focal mass. Portal vein is
patent on color Doppler imaging with normal direction of blood flow
towards the liver.

IVC: No abnormality visualized.

Pancreas: Visualized portion unremarkable.

Spleen: Size and appearance within normal limits.

Right Kidney: Length: 12.4 cm. Echogenicity within normal limits. No
mass or hydronephrosis visualized.

Left Kidney: Length: 11.4 cm. Echogenicity within normal limits. No
mass or hydronephrosis visualized.

Abdominal aorta: No aneurysm visualized.

Other findings: None.
IMPRESSION: 1. Mild increased echogenicity in the liver is nonspecific but often
due to hepatic steatosis. No other abnormalities.

## 2023-01-28 ENCOUNTER — Ambulatory Visit
Admission: EM | Admit: 2023-01-28 | Discharge: 2023-01-28 | Disposition: A | Discharge status: Home / Self Care | Payer: Federal, State, Local not specified - PPO | Attending: Emergency Medicine | Admitting: Emergency Medicine

## 2023-01-28 ENCOUNTER — Encounter: Payer: Self-pay | Admitting: Emergency Medicine

## 2023-01-28 DIAGNOSIS — J069 Acute upper respiratory infection, unspecified: Secondary | ICD-10-CM | POA: Insufficient documentation

## 2023-01-28 DIAGNOSIS — H9203 Otalgia, bilateral: Secondary | ICD-10-CM | POA: Diagnosis present

## 2023-01-28 DIAGNOSIS — R519 Headache, unspecified: Secondary | ICD-10-CM | POA: Diagnosis present

## 2023-01-28 LAB — GROUP A STREP BY PCR: Group A Strep by PCR: NOT DETECTED

## 2023-01-28 MED ORDER — IPRATROPIUM BROMIDE 0.06 % NA SOLN: 2.0000 | Freq: Four times a day (QID) | NASAL | 12 refills | Status: AC

## 2023-01-28 MED ORDER — BENZONATATE 100 MG PO CAPS: 200.0000 mg | ORAL_CAPSULE | Freq: Three times a day (TID) | ORAL | 0 refills | Status: AC

## 2023-01-28 MED ORDER — PROMETHAZINE-DM 6.25-15 MG/5ML PO SYRP: 5.0000 mL | ORAL_SOLUTION | Freq: Four times a day (QID) | ORAL | 0 refills | Status: AC | PRN

## 2023-01-28 NOTE — ED Provider Notes (Signed)
[facility]    CSN: [id number] Arrival date & time: 01/28/23  8469      History   Chief Complaint Chief Complaint  Patient presents with   Nasal Congestion   Cough   Sore Throat   Headache   Otalgia    HPI Bonnie Hoffman is a 52 y.o. female.   HPI  52 year old female with a past medical history significant for hypertension and environmental allergies presents for evaluation of 4 days with respiratory symptoms to include headache, runny nose, pain in both of her ears, sore throat, and a nonproductive cough.  She denies fever, shortness breath, wheezing, GI complaints, or recent travel.  She thinks she may have been exposed to someone with similar symptoms but is unsure.  Past Medical History:  Diagnosis Date   Environmental allergies    Hypertension     There are no problems to display for this patient.   Past Surgical History:  Procedure Laterality Date   NO PAST SURGERIES      OB History   No obstetric history on file.      Home Medications    Prior to Admission medications   Medication Sig Start Date End Date Taking? Authorizing Provider  benzonatate (TESSALON) 100 MG capsule Take 2 capsules (200 mg total) by mouth every 8 (eight) hours. 01/28/23  Yes Becky Augusta, NP  ipratropium (ATROVENT) 0.06 % nasal spray Place 2 sprays into both nostrils 4 (four) times daily. 01/28/23  Yes Becky Augusta, NP  losartan (COZAAR) 50 MG tablet Take 1 tablet by mouth daily. 06/07/20  Yes [provider]  promethazine-dextromethorphan (PROMETHAZINE-DM) 6.25-15 MG/5ML syrup Take 5 mLs by mouth 4 (four) times daily as needed. 01/28/23  Yes Becky Augusta, NP  gabapentin (NEURONTIN) 100 MG capsule Take 100-300 mg by mouth at bedtime as needed.    [provider]  loratadine (CLARITIN) 10 MG tablet Take 10 mg by mouth daily.    [provider]  sertraline (ZOLOFT) 25 MG tablet Take 25 mg by mouth daily.    [provider]  SLYND 4 MG  TABS Take 1 tablet by mouth daily.    [provider]  norethindrone-ethinyl estradiol-iron (ESTROSTEP FE,TILIA FE,TRI-LEGEST FE) 1-20/1-30/1-35 MG-MCG tablet Take 1 tablet by mouth daily.  07/24/20  [provider]    Family History Family History  Problem Relation Age of Onset   Healthy Mother    Cancer Father     Social History Social History   Tobacco Use   Smoking status: Never   Smokeless tobacco: Never  Vaping Use   Vaping status: Never Used  Substance Use Topics   Alcohol use: No   Drug use: No     Allergies   Patient has no known allergies.   Review of Systems Review of Systems  Constitutional:  Negative for fever.  HENT:  Positive for congestion, ear pain, rhinorrhea and sore throat.   Respiratory:  Positive for cough. Negative for shortness of breath and wheezing.   Gastrointestinal:  Negative for diarrhea, nausea and vomiting.  Neurological:  Positive for headaches.     Physical Exam Triage Vital Signs ED Triage Vitals  Encounter Vitals Group     BP 01/28/23 0843 110/83     Systolic BP Percentile --      Diastolic BP Percentile --      Pulse Rate 01/28/23 0843 95     Resp 01/28/23 0843 16     Temp 01/28/23 0843 98.4 F (  36.9 C)     Temp Source 01/28/23 0843 Oral     SpO2 01/28/23 0843 97 %     Weight --      Height --      Head Circumference --      Peak Flow --      Pain Score 01/28/23 0841 4     Pain Loc --      Pain Education --      Exclude from Growth Chart --    No data found.  Updated Vital Signs BP 110/83 (BP Location: Left Arm)   Pulse 95   Temp 98.4 F (36.9 C) (Oral)   Resp 16   SpO2 97%   Visual Acuity Right Eye Distance:   Left Eye Distance:   Bilateral Distance:    Right Eye Near:   Left Eye Near:    Bilateral Near:     Physical Exam Vitals and nursing note reviewed.  Constitutional:      Appearance: Normal appearance. She is not ill-appearing.  HENT:     Head: Normocephalic and atraumatic.      Right Ear: Tympanic membrane, ear canal and external ear normal. There is no impacted cerumen.     Left Ear: Tympanic membrane, ear canal and external ear normal. There is no impacted cerumen.     Nose: Congestion and rhinorrhea present.     Comments: Nasal mucosa is mildly edematous with clear discharge in both nares.    Mouth/Throat:     Mouth: Mucous membranes are moist.     Pharynx: Oropharynx is clear. Posterior oropharyngeal erythema present. No oropharyngeal exudate.     Comments: Petechiae to the posterior aspect of the hard palate and erythema of the soft palate and tonsillar pillars.  No appreciable exudate. Cardiovascular:     Rate and Rhythm: Normal rate and regular rhythm.     Pulses: Normal pulses.     Heart sounds: Normal heart sounds. No murmur heard.    No friction rub. No gallop.  Pulmonary:     Effort: Pulmonary effort is normal.     Breath sounds: Normal breath sounds. No wheezing, rhonchi or rales.  Musculoskeletal:     Cervical back: Normal range of motion and neck supple. No tenderness.  Lymphadenopathy:     Cervical: No cervical adenopathy.  Skin:    General: Skin is warm and dry.     Capillary Refill: Capillary refill takes less than 2 seconds.     Findings: No rash.  Neurological:     General: No focal deficit present.     Mental Status: She is alert and oriented to person, place, and time.      UC Treatments / Results  Labs (all labs ordered are listed, but only abnormal results are displayed) Labs Reviewed  GROUP A STREP BY PCR    EKG   Radiology No results found.  Procedures Procedures (including critical care time)  Medications Ordered in UC Medications - No data to display  Initial Impression / Assessment and Plan / UC Course  I have reviewed the triage vital signs and the nursing notes.  Pertinent labs & imaging results that were available during my care of the patient were reviewed by me and considered in my medical decision  making (see chart for details).   Patient is a pleasant, nontoxic-appearing 52 year old female presenting for evaluation of 4 days with the respiratory symptoms as outlined HPI above.  Of concern on her physical exam, there is erythema  to the soft palate and bilateral tonsillar pillars with some petechiae of the hard palate.  No appreciable exudate.  I will order a strep PCR.  The remainder the patient's exam is consistent with a URI, most likely viral.  Given her symptoms have been present for 4 days, not tested for COVID or influenza at this time.  Strep PCR is negative.  I will discharge patient home with a diagnosis of viral URI with a cough with prescription r Atrovent nasal spray, Tessalon Perles, Promethazine DM cough syrup.  Return precautions reviewed.  Work note provided.   Final Clinical Impressions(s) / UC Diagnoses   Final diagnoses:  Viral URI with cough     Discharge Instructions      Your strep test today was negative.  Your exam is consistent with an upper respiratory infection, most likely viral.  Use over-the-counter Tylenol and/or ibuprofen according the package instructions as needed for any fever or pain you may experience.  Use the Atrovent nasal spray, 2 squirts in each nostril every 6 hours, as needed for runny nose and postnasal drip.  Use the Tessalon Perles every 8 hours during the day.  Take them with a small sip of water.  They may give you some numbness to the base of your tongue or a metallic taste in your mouth, this is normal.  Use the Promethazine DM cough syrup at bedtime for cough and congestion.  It will make you drowsy so do not take it during the day.  Return for reevaluation or see your primary care provider for any new or worsening symptoms.      ED Prescriptions     Medication Sig Dispense Auth. Provider   benzonatate (TESSALON) 100 MG capsule Take 2 capsules (200 mg total) by mouth every 8 (eight) hours. 21 capsule Becky Augusta, NP    ipratropium (ATROVENT) 0.06 % nasal spray Place 2 sprays into both nostrils 4 (four) times daily. 15 mL Becky Augusta, NP   promethazine-dextromethorphan (PROMETHAZINE-DM) 6.25-15 MG/5ML syrup Take 5 mLs by mouth 4 (four) times daily as needed. 118 mL Becky Augusta, NP      PDMP not reviewed this encounter.   Becky Augusta, NP 01/28/23 1047

## 2023-01-28 NOTE — Discharge Instructions (Signed)
Your strep test today was negative.  Your exam is consistent with an upper respiratory infection, most likely viral.  Use over-the-counter Tylenol and/or ibuprofen according the package instructions as needed for any fever or pain you may experience.  Use the Atrovent nasal spray, 2 squirts in each nostril every 6 hours, as needed for runny nose and postnasal drip.  Use the Tessalon Perles every 8 hours during the day.  Take them with a small sip of water.  They may give you some numbness to the base of your tongue or a metallic taste in your mouth, this is normal.  Use the Promethazine DM cough syrup at bedtime for cough and congestion.  It will make you drowsy so do not take it during the day.  Return for reevaluation or see your primary care provider for any new or worsening symptoms.

## 2023-01-28 NOTE — ED Triage Notes (Addendum)
Pt c/o cough, runny nose, headache, sore throat and bilateral ear pain x 4 days. Pt has tried OTC cold medication for her  symptoms. At home covid test negative this morning.
